# Patient Record
Sex: Female | Born: 1950 | Race: White | Hispanic: No | Smoking: Current every day smoker
Health system: Southern US, Community
[De-identification: ages and names within clinical notes are randomized; demographics above are authoritative.]

## PROBLEM LIST (undated history)

## (undated) DIAGNOSIS — M25559 Pain in unspecified hip: Secondary | ICD-10-CM

## (undated) DIAGNOSIS — F329 Major depressive disorder, single episode, unspecified: Secondary | ICD-10-CM

## (undated) DIAGNOSIS — F32A Depression, unspecified: Secondary | ICD-10-CM

## (undated) HISTORY — PX: HIP SURGERY: SHX245

---

## 2015-08-03 ENCOUNTER — Other Ambulatory Visit: Payer: Self-pay | Admitting: Internal Medicine

## 2015-08-06 ENCOUNTER — Other Ambulatory Visit: Payer: Self-pay | Admitting: Internal Medicine

## 2015-08-06 DIAGNOSIS — Z86010 Personal history of colonic polyps: Secondary | ICD-10-CM

## 2015-08-06 DIAGNOSIS — R1012 Left upper quadrant pain: Secondary | ICD-10-CM

## 2015-08-06 DIAGNOSIS — R634 Abnormal weight loss: Secondary | ICD-10-CM

## 2015-08-21 ENCOUNTER — Inpatient Hospital Stay: Admission: RE | Admit: 2015-08-21 | Payer: Self-pay | Source: Ambulatory Visit

## 2015-08-28 ENCOUNTER — Ambulatory Visit
Admission: RE | Admit: 2015-08-28 | Discharge: 2015-08-28 | Disposition: A | Discharge status: Home / Self Care | Payer: Self-pay | Source: Ambulatory Visit | Attending: Internal Medicine | Admitting: Internal Medicine

## 2015-08-28 DIAGNOSIS — Z86010 Personal history of colonic polyps: Secondary | ICD-10-CM

## 2015-08-28 DIAGNOSIS — R1012 Left upper quadrant pain: Secondary | ICD-10-CM

## 2015-08-28 DIAGNOSIS — R634 Abnormal weight loss: Secondary | ICD-10-CM

## 2017-02-05 ENCOUNTER — Emergency Department (HOSPITAL_COMMUNITY)
Admission: EM | Admit: 2017-02-05 | Discharge: 2017-02-06 | Disposition: A | Discharge status: Home / Self Care | Payer: Medicare Other | Attending: Emergency Medicine | Admitting: Emergency Medicine

## 2017-02-05 DIAGNOSIS — Z046 Encounter for general psychiatric examination, requested by authority: Secondary | ICD-10-CM | POA: Insufficient documentation

## 2017-02-05 DIAGNOSIS — Z733 Stress, not elsewhere classified: Secondary | ICD-10-CM | POA: Insufficient documentation

## 2017-02-05 DIAGNOSIS — N3000 Acute cystitis without hematuria: Secondary | ICD-10-CM | POA: Insufficient documentation

## 2017-02-05 DIAGNOSIS — R410 Disorientation, unspecified: Secondary | ICD-10-CM

## 2017-02-05 DIAGNOSIS — R300 Dysuria: Secondary | ICD-10-CM | POA: Insufficient documentation

## 2017-02-05 DIAGNOSIS — E876 Hypokalemia: Secondary | ICD-10-CM | POA: Diagnosis not present

## 2017-02-05 DIAGNOSIS — R4182 Altered mental status, unspecified: Secondary | ICD-10-CM | POA: Diagnosis present

## 2017-02-05 LAB — CBC WITH DIFFERENTIAL/PLATELET
Basophils Absolute: 0 10*3/uL (ref 0.0–0.1)
Basophils Relative: 0 %
Eosinophils Absolute: 0 10*3/uL (ref 0.0–0.7)
Eosinophils Relative: 0 %
HEMATOCRIT: 36.1 % (ref 36.0–46.0)
HEMOGLOBIN: 12.5 g/dL (ref 12.0–15.0)
LYMPHS ABS: 1.3 10*3/uL (ref 0.7–4.0)
Lymphocytes Relative: 12 %
MCH: 32.7 pg (ref 26.0–34.0)
MCHC: 34.6 g/dL (ref 30.0–36.0)
MCV: 94.5 fL (ref 78.0–100.0)
MONOS PCT: 8 %
Monocytes Absolute: 0.9 10*3/uL (ref 0.1–1.0)
NEUTROS ABS: 9 10*3/uL — AB (ref 1.7–7.7)
NEUTROS PCT: 80 %
Platelets: 255 10*3/uL (ref 150–400)
RBC: 3.82 MIL/uL — AB (ref 3.87–5.11)
RDW: 13.5 % (ref 11.5–15.5)
WBC: 11.3 10*3/uL — AB (ref 4.0–10.5)

## 2017-02-05 LAB — URINALYSIS, ROUTINE W REFLEX MICROSCOPIC
Bilirubin Urine: NEGATIVE
GLUCOSE, UA: NEGATIVE mg/dL
KETONES UR: NEGATIVE mg/dL
Leukocytes, UA: NEGATIVE
NITRITE: NEGATIVE
PROTEIN: NEGATIVE mg/dL
Specific Gravity, Urine: 1.019 (ref 1.005–1.030)
pH: 5 (ref 5.0–8.0)

## 2017-02-05 LAB — BASIC METABOLIC PANEL
Anion gap: 12 (ref 5–15)
BUN: 18 mg/dL (ref 6–20)
CHLORIDE: 102 mmol/L (ref 101–111)
CO2: 24 mmol/L (ref 22–32)
CREATININE: 0.56 mg/dL (ref 0.44–1.00)
Calcium: 9.2 mg/dL (ref 8.9–10.3)
GFR calc non Af Amer: >60 mL/min (ref >60)
Glucose, Bld: 126 mg/dL — ABNORMAL HIGH (ref 65–99)
POTASSIUM: 3 mmol/L — AB (ref 3.5–5.1)
SODIUM: 138 mmol/L (ref 135–145)

## 2017-02-05 MED ORDER — ACETAMINOPHEN 325 MG PO TABS
650.0000 mg | ORAL_TABLET | ORAL | Status: DC | PRN
  Administered 2017-02-05 – 2017-02-06 (×3): 650 mg via ORAL
  Filled 2017-02-05 (×3): qty 2

## 2017-02-05 MED ORDER — CEPHALEXIN 500 MG PO CAPS
500.0000 mg | ORAL_CAPSULE | Freq: Three times a day (TID) | ORAL | Status: DC
  Administered 2017-02-05 – 2017-02-06 (×2): 500 mg via ORAL
  Filled 2017-02-05 (×2): qty 1

## 2017-02-05 MED ORDER — CEPHALEXIN 500 MG PO CAPS: 500.0000 mg | ORAL_CAPSULE | Freq: Four times a day (QID) | ORAL | 0 refills | Status: DC

## 2017-02-05 MED ORDER — POTASSIUM CHLORIDE CRYS ER 20 MEQ PO TBCR
40.0000 meq | EXTENDED_RELEASE_TABLET | Freq: Once | ORAL | Status: AC
  Administered 2017-02-06: 40 meq via ORAL
  Filled 2017-02-05: qty 2

## 2017-02-05 MED ORDER — POTASSIUM CHLORIDE CRYS ER 20 MEQ PO TBCR
40.0000 meq | EXTENDED_RELEASE_TABLET | Freq: Once | ORAL | Status: AC
  Administered 2017-02-05: 40 meq via ORAL
  Filled 2017-02-05: qty 2

## 2017-02-05 NOTE — ED Notes (Signed)
Thus far we have gotten her a glass of Coke; a Malaw[place]urkey sandwich and a cup of coffee. As I write this, she is again making a phone call. She remains quite lucid.
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
## 2017-02-05 NOTE — Discharge Instructions (Signed)
Follow-up when you get back home to have your potassium rechecked.  Take the antibiotic Keflex as directed for the next 7 days.  Make an appointment to follow-up with your doctor.
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
## 2017-02-05 NOTE — ED Notes (Signed)
Bed: [id number] Expected date:  Expected time:  Means of arrival:  Comments: 66 yo medical clearance
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
## 2017-02-06 ENCOUNTER — Observation Stay (HOSPITAL_COMMUNITY)
Admission: EM | Admit: 2017-02-06 | Discharge: 2017-02-10 | Disposition: A | Discharge status: Home / Self Care | Payer: Medicare Other | Attending: Family Medicine | Admitting: Family Medicine

## 2017-02-06 ENCOUNTER — Emergency Department (HOSPITAL_COMMUNITY): Payer: Medicare Other

## 2017-02-06 ENCOUNTER — Other Ambulatory Visit: Payer: Self-pay

## 2017-02-06 ENCOUNTER — Encounter (HOSPITAL_COMMUNITY): Payer: Self-pay | Admitting: Cardiology

## 2017-02-06 DIAGNOSIS — M25551 Pain in right hip: Secondary | ICD-10-CM | POA: Diagnosis present

## 2017-02-06 DIAGNOSIS — Y999 Unspecified external cause status: Secondary | ICD-10-CM | POA: Insufficient documentation

## 2017-02-06 DIAGNOSIS — Z720 Tobacco use: Secondary | ICD-10-CM | POA: Diagnosis not present

## 2017-02-06 DIAGNOSIS — Y929 Unspecified place or not applicable: Secondary | ICD-10-CM | POA: Insufficient documentation

## 2017-02-06 DIAGNOSIS — E46 Unspecified protein-calorie malnutrition: Secondary | ICD-10-CM | POA: Diagnosis present

## 2017-02-06 DIAGNOSIS — S42001D Fracture of unspecified part of right clavicle, subsequent encounter for fracture with routine healing: Secondary | ICD-10-CM | POA: Diagnosis not present

## 2017-02-06 DIAGNOSIS — S42001A Fracture of unspecified part of right clavicle, initial encounter for closed fracture: Secondary | ICD-10-CM | POA: Diagnosis present

## 2017-02-06 DIAGNOSIS — F05 Delirium due to known physiological condition: Secondary | ICD-10-CM

## 2017-02-06 DIAGNOSIS — F322 Major depressive disorder, single episode, severe without psychotic features: Secondary | ICD-10-CM | POA: Diagnosis not present

## 2017-02-06 DIAGNOSIS — S20301A Unspecified superficial injuries of right front wall of thorax, initial encounter: Secondary | ICD-10-CM | POA: Insufficient documentation

## 2017-02-06 DIAGNOSIS — G9341 Metabolic encephalopathy: Secondary | ICD-10-CM

## 2017-02-06 DIAGNOSIS — S32010A Wedge compression fracture of first lumbar vertebra, initial encounter for closed fracture: Secondary | ICD-10-CM | POA: Diagnosis not present

## 2017-02-06 DIAGNOSIS — Y939 Activity, unspecified: Secondary | ICD-10-CM | POA: Insufficient documentation

## 2017-02-06 DIAGNOSIS — S0990XA Unspecified injury of head, initial encounter: Secondary | ICD-10-CM | POA: Insufficient documentation

## 2017-02-06 DIAGNOSIS — S3210XA Unspecified fracture of sacrum, initial encounter for closed fracture: Principal | ICD-10-CM | POA: Insufficient documentation

## 2017-02-06 DIAGNOSIS — E876 Hypokalemia: Secondary | ICD-10-CM | POA: Diagnosis not present

## 2017-02-06 DIAGNOSIS — Z79899 Other long term (current) drug therapy: Secondary | ICD-10-CM | POA: Diagnosis not present

## 2017-02-06 DIAGNOSIS — F17200 Nicotine dependence, unspecified, uncomplicated: Secondary | ICD-10-CM | POA: Insufficient documentation

## 2017-02-06 DIAGNOSIS — S3992XA Unspecified injury of lower back, initial encounter: Secondary | ICD-10-CM | POA: Diagnosis not present

## 2017-02-06 DIAGNOSIS — S2241XA Multiple fractures of ribs, right side, initial encounter for closed fracture: Secondary | ICD-10-CM | POA: Insufficient documentation

## 2017-02-06 HISTORY — DX: Pain in unspecified hip: M25.559

## 2017-02-06 HISTORY — DX: Depression, unspecified: F32.A

## 2017-02-06 HISTORY — DX: Major depressive disorder, single episode, unspecified: F32.9

## 2017-02-06 LAB — COMPREHENSIVE METABOLIC PANEL
ALT: 56 U/L — ABNORMAL HIGH (ref 14–54)
AST: 41 U/L (ref 15–41)
Albumin: 4.1 g/dL (ref 3.5–5.0)
Alkaline Phosphatase: 99 U/L (ref 38–126)
Anion gap: 11 (ref 5–15)
BUN: 17 mg/dL (ref 6–20)
CHLORIDE: 106 mmol/L (ref 101–111)
CO2: 26 mmol/L (ref 22–32)
Calcium: 9.7 mg/dL (ref 8.9–10.3)
Creatinine, Ser: 0.54 mg/dL (ref 0.44–1.00)
GFR calc Af Amer: >60 mL/min (ref >60)
Glucose, Bld: 103 mg/dL — ABNORMAL HIGH (ref 65–99)
POTASSIUM: 3.1 mmol/L — AB (ref 3.5–5.1)
Sodium: 143 mmol/L (ref 135–145)
Total Bilirubin: 0.8 mg/dL (ref 0.3–1.2)
Total Protein: 7.8 g/dL (ref 6.5–8.1)

## 2017-02-06 LAB — RAPID URINE DRUG SCREEN, HOSP PERFORMED
AMPHETAMINES: NOT DETECTED
BENZODIAZEPINES: NOT DETECTED
Barbiturates: POSITIVE — AB
COCAINE: NOT DETECTED
OPIATES: NOT DETECTED
Tetrahydrocannabinol: NOT DETECTED

## 2017-02-06 LAB — SALICYLATE LEVEL: Salicylate Lvl: <7 mg/dL (ref 2.8–30.0)

## 2017-02-06 LAB — CBC WITH DIFFERENTIAL/PLATELET
BASOS ABS: 0 10*3/uL (ref 0.0–0.1)
BASOS PCT: 0 %
EOS PCT: 0 %
Eosinophils Absolute: 0 10*3/uL (ref 0.0–0.7)
HCT: 39 % (ref 36.0–46.0)
Hemoglobin: 12.7 g/dL (ref 12.0–15.0)
LYMPHS PCT: 7 %
Lymphs Abs: 0.8 10*3/uL (ref 0.7–4.0)
MCH: 31.5 pg (ref 26.0–34.0)
MCHC: 32.6 g/dL (ref 30.0–36.0)
MCV: 96.8 fL (ref 78.0–100.0)
MONO ABS: 0.9 10*3/uL (ref 0.1–1.0)
Monocytes Relative: 7 %
Neutro Abs: 10.5 10*3/uL — ABNORMAL HIGH (ref 1.7–7.7)
Neutrophils Relative %: 86 %
PLATELETS: 228 10*3/uL (ref 150–400)
RBC: 4.03 MIL/uL (ref 3.87–5.11)
RDW: 13.5 % (ref 11.5–15.5)
WBC: 12.3 10*3/uL — ABNORMAL HIGH (ref 4.0–10.5)

## 2017-02-06 LAB — MAGNESIUM: MAGNESIUM: 2.1 mg/dL (ref 1.7–2.4)

## 2017-02-06 LAB — ACETAMINOPHEN LEVEL: Acetaminophen (Tylenol), Serum: <10 ug/mL — ABNORMAL LOW (ref 10–30)

## 2017-02-06 LAB — MRSA PCR SCREENING: MRSA BY PCR: NEGATIVE

## 2017-02-06 LAB — ETHANOL

## 2017-02-06 MED ORDER — FOLIC ACID 1 MG PO TABS
1.0000 mg | ORAL_TABLET | Freq: Every day | ORAL | Status: DC
  Administered 2017-02-07 – 2017-02-10 (×4): 1 mg via ORAL
  Filled 2017-02-06 (×4): qty 1

## 2017-02-06 MED ORDER — ENOXAPARIN SODIUM 40 MG/0.4ML ~~LOC~~ SOLN
40.0000 mg | SUBCUTANEOUS | Status: DC
  Administered 2017-02-06: 40 mg via SUBCUTANEOUS
  Filled 2017-02-06: qty 0.4

## 2017-02-06 MED ORDER — VITAMIN B-1 100 MG PO TABS
100.0000 mg | ORAL_TABLET | Freq: Every day | ORAL | Status: DC
  Administered 2017-02-07 – 2017-02-10 (×4): 100 mg via ORAL
  Filled 2017-02-06 (×4): qty 1

## 2017-02-06 MED ORDER — ONDANSETRON HCL 4 MG PO TABS
4.0000 mg | ORAL_TABLET | Freq: Four times a day (QID) | ORAL | Status: DC | PRN
  Administered 2017-02-07: 4 mg via ORAL
  Filled 2017-02-06: qty 1

## 2017-02-06 MED ORDER — OXYCODONE-ACETAMINOPHEN 5-325 MG PO TABS
1.0000 | ORAL_TABLET | Freq: Once | ORAL | Status: AC
  Administered 2017-02-06: 1 via ORAL
  Filled 2017-02-06: qty 1

## 2017-02-06 MED ORDER — IOPAMIDOL (ISOVUE-300) INJECTION 61%
100.0000 mL | Freq: Once | INTRAVENOUS | Status: AC | PRN
  Administered 2017-02-06: 100 mL via INTRAVENOUS

## 2017-02-06 MED ORDER — LORAZEPAM 1 MG PO TABS
1.0000 mg | ORAL_TABLET | Freq: Four times a day (QID) | ORAL | Status: AC | PRN
  Administered 2017-02-06 – 2017-02-09 (×2): 1 mg via ORAL
  Filled 2017-02-06 (×2): qty 1

## 2017-02-06 MED ORDER — ADULT MULTIVITAMIN W/MINERALS CH
1.0000 | ORAL_TABLET | Freq: Every day | ORAL | Status: DC
  Administered 2017-02-07 – 2017-02-10 (×4): 1 via ORAL
  Filled 2017-02-06 (×4): qty 1

## 2017-02-06 MED ORDER — ACETAMINOPHEN 650 MG RE SUPP: 650.0000 mg | Freq: Four times a day (QID) | RECTAL | Status: DC | PRN

## 2017-02-06 MED ORDER — LORAZEPAM 2 MG/ML IJ SOLN
1.0000 mg | Freq: Four times a day (QID) | INTRAMUSCULAR | Status: AC | PRN
  Administered 2017-02-07 – 2017-02-09 (×5): 1 mg via INTRAVENOUS
  Filled 2017-02-06 (×5): qty 1

## 2017-02-06 MED ORDER — POTASSIUM CHLORIDE CRYS ER 20 MEQ PO TBCR
40.0000 meq | EXTENDED_RELEASE_TABLET | Freq: Once | ORAL | Status: AC
  Administered 2017-02-06: 40 meq via ORAL
  Filled 2017-02-06: qty 2

## 2017-02-06 MED ORDER — ONDANSETRON HCL 4 MG/2ML IJ SOLN
4.0000 mg | Freq: Four times a day (QID) | INTRAMUSCULAR | Status: DC | PRN
  Administered 2017-02-07 – 2017-02-08 (×2): 4 mg via INTRAVENOUS
  Filled 2017-02-06 (×2): qty 2

## 2017-02-06 MED ORDER — THIAMINE HCL 100 MG/ML IJ SOLN: 100.0000 mg | Freq: Every day | INTRAMUSCULAR | Status: DC

## 2017-02-06 MED ORDER — KETOROLAC TROMETHAMINE 15 MG/ML IJ SOLN
15.0000 mg | Freq: Four times a day (QID) | INTRAMUSCULAR | Status: DC | PRN
  Administered 2017-02-10 (×2): 15 mg via INTRAVENOUS
  Filled 2017-02-06 (×2): qty 1

## 2017-02-06 MED ORDER — ACETAMINOPHEN 325 MG PO TABS: 650.0000 mg | ORAL_TABLET | Freq: Four times a day (QID) | ORAL | Status: DC | PRN

## 2017-02-06 MED ORDER — POTASSIUM CHLORIDE IN NACL 40-0.9 MEQ/L-% IV SOLN
INTRAVENOUS | Status: DC
  Administered 2017-02-06 – 2017-02-08 (×3): 100 mL/h via INTRAVENOUS
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
## 2017-02-06 NOTE — ED Notes (Signed)
Pt still unable to provide urine specimen 
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
## 2017-02-06 NOTE — ED Notes (Signed)
ED Provider at bedside. 

## 2017-02-06 NOTE — ED Notes (Signed)
RN went in to prepare pt for discharge, pt [place] [facility] [place] [facility] [place] [place]. I informed pt that pt has two options. Leaving with Deputy who would drive pt to car or pt can go to lobby and wait and attempt to find transportation by other means. Pt is extremely anxious and will leave with deputy.
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
## 2017-02-07 DIAGNOSIS — E46 Unspecified protein-calorie malnutrition: Secondary | ICD-10-CM | POA: Diagnosis not present

## 2017-02-07 DIAGNOSIS — S3210XA Unspecified fracture of sacrum, initial encounter for closed fracture: Secondary | ICD-10-CM | POA: Diagnosis not present

## 2017-02-07 DIAGNOSIS — S32010G Wedge compression fracture of first lumbar vertebra, subsequent encounter for fracture with delayed healing: Secondary | ICD-10-CM | POA: Diagnosis not present

## 2017-02-07 DIAGNOSIS — E876 Hypokalemia: Secondary | ICD-10-CM | POA: Diagnosis not present

## 2017-02-07 DIAGNOSIS — S322XXA Fracture of coccyx, initial encounter for closed fracture: Secondary | ICD-10-CM

## 2017-02-07 DIAGNOSIS — Z720 Tobacco use: Secondary | ICD-10-CM | POA: Diagnosis not present

## 2017-02-07 DIAGNOSIS — F17200 Nicotine dependence, unspecified, uncomplicated: Secondary | ICD-10-CM

## 2017-02-07 DIAGNOSIS — S3210XG Unspecified fracture of sacrum, subsequent encounter for fracture with delayed healing: Secondary | ICD-10-CM | POA: Diagnosis not present

## 2017-02-07 DIAGNOSIS — S42309D Unspecified fracture of shaft of humerus, unspecified arm, subsequent encounter for fracture with routine healing: Secondary | ICD-10-CM

## 2017-02-07 DIAGNOSIS — S42001D Fracture of unspecified part of right clavicle, subsequent encounter for fracture with routine healing: Secondary | ICD-10-CM | POA: Diagnosis not present

## 2017-02-07 DIAGNOSIS — G9341 Metabolic encephalopathy: Secondary | ICD-10-CM | POA: Diagnosis not present

## 2017-02-07 DIAGNOSIS — S34139A Unspecified injury to sacral spinal cord, initial encounter: Secondary | ICD-10-CM

## 2017-02-07 LAB — CBC
HCT: 34.4 % — ABNORMAL LOW (ref 36.0–46.0)
Hemoglobin: 11.2 g/dL — ABNORMAL LOW (ref 12.0–15.0)
MCH: 31.6 pg (ref 26.0–34.0)
MCHC: 32.6 g/dL (ref 30.0–36.0)
MCV: 97.2 fL (ref 78.0–100.0)
PLATELETS: 200 10*3/uL (ref 150–400)
RBC: 3.54 MIL/uL — AB (ref 3.87–5.11)
RDW: 13.6 % (ref 11.5–15.5)
WBC: 8.2 10*3/uL (ref 4.0–10.5)

## 2017-02-07 LAB — TSH: TSH: 1.338 u[IU]/mL (ref 0.350–4.500)

## 2017-02-07 LAB — T4, FREE: Free T4: 0.95 ng/dL (ref 0.61–1.12)

## 2017-02-07 LAB — BASIC METABOLIC PANEL
Anion gap: 11 (ref 5–15)
BUN: 16 mg/dL (ref 6–20)
CALCIUM: 8.7 mg/dL — AB (ref 8.9–10.3)
CO2: 20 mmol/L — ABNORMAL LOW (ref 22–32)
CREATININE: 0.49 mg/dL (ref 0.44–1.00)
Chloride: 104 mmol/L (ref 101–111)
GFR calc non Af Amer: >60 mL/min (ref >60)
Glucose, Bld: 92 mg/dL (ref 65–99)
Potassium: 4.2 mmol/L (ref 3.5–5.1)
SODIUM: 135 mmol/L (ref 135–145)

## 2017-02-07 LAB — VITAMIN B12: Vitamin B-12: 221 pg/mL (ref 180–914)

## 2017-02-07 LAB — AMMONIA: AMMONIA: 12 umol/L (ref 9–35)

## 2017-02-07 LAB — GLUCOSE, CAPILLARY: GLUCOSE-CAPILLARY: 86 mg/dL (ref 65–99)

## 2017-02-07 MED ORDER — ACETAMINOPHEN 500 MG PO TABS: 1000.0000 mg | ORAL_TABLET | Freq: Four times a day (QID) | ORAL | Status: DC | PRN

## 2017-02-07 MED ORDER — PLECANATIDE 3 MG PO TABS: 1.0000 | ORAL_TABLET | Freq: Every day | ORAL | Status: DC

## 2017-02-07 MED ORDER — ENSURE ENLIVE PO LIQD
237.0000 mL | Freq: Two times a day (BID) | ORAL | Status: DC
  Administered 2017-02-08 – 2017-02-09 (×3): 237 mL via ORAL

## 2017-02-07 MED ORDER — HYDROCODONE-ACETAMINOPHEN 5-325 MG PO TABS
1.0000 | ORAL_TABLET | ORAL | Status: DC | PRN
  Administered 2017-02-07 – 2017-02-10 (×11): 1 via ORAL
  Filled 2017-02-07 (×11): qty 1

## 2017-02-07 MED ORDER — AMITRIPTYLINE HCL 25 MG PO TABS: 50.0000 mg | ORAL_TABLET | Freq: Every evening | ORAL | Status: DC | PRN

## 2017-02-07 MED ORDER — ENOXAPARIN SODIUM 30 MG/0.3ML ~~LOC~~ SOLN
30.0000 mg | SUBCUTANEOUS | Status: DC
  Administered 2017-02-07 – 2017-02-08 (×2): 30 mg via SUBCUTANEOUS
  Filled 2017-02-07 (×2): qty 0.3

## 2017-02-07 NOTE — Care Management Obs Status (Signed)
MEDICARE OBSERVATION STATUS NOTIFICATION   Patient Details  Name: Vanessa Bailey MRN: [id number] Date of Birth: 05/14/1950   Medicare Observation Status Notification Given:  Yes    Mehar Sagen, Chrystine OilerSharley Diane, RN 02/07/2017, 2:32 PM

## 2017-02-07 NOTE — Plan of Care (Signed)
  Acute Rehab PT Goals(only PT should resolve) Pt Will Go Supine/Side To Sit 02/07/2017 1525 - Progressing by Ocie BobWatkins, Kol Consuegra, PT Flowsheets Taken 02/07/2017 1525  Pt will go Supine/Side to Sit with minimal assist Pt Will Go Sit To Supine/Side 02/07/2017 1525 - Progressing by Ocie BobWatkins, Blessed Girdner, PT Flowsheets Taken 02/07/2017 1525  Pt will go Sit to Supine/Side with minimal assist Patient Will Transfer Sit To/From Stand 02/07/2017 1525 - Progressing by Ocie BobWatkins, Lonell Stamos, PT Flowsheets Taken 02/07/2017 1525  Patient will transfer sit to/from stand with moderate assist Pt Will Transfer Bed To Chair/Chair To Bed 02/07/2017 1525 - Progressing by Ocie BobWatkins, Karynn Deblasi, PT Flowsheets Taken 02/07/2017 1525  Pt will Transfer Bed to Chair/Chair to Bed with mod assist Pt Will Ambulate 02/07/2017 1525 - Progressing by Ocie BobWatkins, Terriah Reggio, PT Flowsheets Taken 02/07/2017 1525  Pt will Ambulate 15 feet;with moderate assist;with rolling walker  3:26 PM, 02/07/17 Ocie BobJames Chiara Coltrin, MPT Physical Therapist with [facility] 336 [phone] office [phone] mobile phone

## 2017-02-07 NOTE — Progress Notes (Signed)
Patient meets criteria for inpatient treatment. CSW faxed referrals to the following inpatient facilities for review:  [facility], [facility], [facility], [facility], [facility], [facility]   TTS will continue to seek bed placement.   Vanessa DaubJolan Dru Bailey MSW, LCSWA CSW Disposition [phone]
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
## 2017-02-07 NOTE — Progress Notes (Signed)
Patient alert to self, disoriented to time/date/place/situation. Pt noted referring to persons who are not there stating "There is a man right there, you don't see him?" Pt noted reaching out "grasping" objects "floating" above her that are not present. Poor physical and oral hygiene noted. Pt is very thin and reports that she gets nauseous when she smells food and that makes her not want to eat. Pt reports that her husband passed away 5wks ago and becomes very upset & tearful when she speaks of him. Patient transferred to Dept 300 room# 307 on telemetry. Report called and given to receiving nurse Angie.
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
## 2017-02-07 NOTE — Progress Notes (Signed)
Transported from ICU at 1235.  Alert and oriented to name and cannot state date and stated was at grandmother's house. Speech rambling then and nonsensical .  Fidgity.  Swallowed water and pain pill.  Right arm in sling.  Vitals stable. Bed alarm set
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
## 2017-02-07 NOTE — Care Management (Signed)
Writer followed up on the following referrals: [facility] - Left a message [place] - at capacity, per [facility] [place]- Still reviewing referral, per [facility]- Ref[facility] will be re-faxed  [facility]  [facility]
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
## 2017-02-08 ENCOUNTER — Encounter (HOSPITAL_COMMUNITY): Payer: Self-pay | Admitting: Registered Nurse

## 2017-02-08 DIAGNOSIS — S32010G Wedge compression fracture of first lumbar vertebra, subsequent encounter for fracture with delayed healing: Secondary | ICD-10-CM

## 2017-02-08 DIAGNOSIS — S32010A Wedge compression fracture of first lumbar vertebra, initial encounter for closed fracture: Secondary | ICD-10-CM | POA: Diagnosis not present

## 2017-02-08 DIAGNOSIS — F05 Delirium due to known physiological condition: Secondary | ICD-10-CM | POA: Diagnosis not present

## 2017-02-08 DIAGNOSIS — S3210XG Unspecified fracture of sacrum, subsequent encounter for fracture with delayed healing: Secondary | ICD-10-CM | POA: Diagnosis not present

## 2017-02-08 DIAGNOSIS — G9341 Metabolic encephalopathy: Secondary | ICD-10-CM | POA: Diagnosis not present

## 2017-02-08 DIAGNOSIS — Z720 Tobacco use: Secondary | ICD-10-CM

## 2017-02-08 DIAGNOSIS — S3210XA Unspecified fracture of sacrum, initial encounter for closed fracture: Secondary | ICD-10-CM | POA: Diagnosis not present

## 2017-02-08 DIAGNOSIS — E876 Hypokalemia: Secondary | ICD-10-CM | POA: Diagnosis not present

## 2017-02-08 DIAGNOSIS — S42001D Fracture of unspecified part of right clavicle, subsequent encounter for fracture with routine healing: Secondary | ICD-10-CM

## 2017-02-08 DIAGNOSIS — E46 Unspecified protein-calorie malnutrition: Secondary | ICD-10-CM

## 2017-02-08 LAB — BASIC METABOLIC PANEL
ANION GAP: 7 (ref 5–15)
ANION GAP: 7 (ref 5–15)
BUN: 28 mg/dL — ABNORMAL HIGH (ref 6–20)
BUN: 22 mg/dL — AB (ref 6–20)
CALCIUM: 9 mg/dL (ref 8.9–10.3)
CALCIUM: 8.5 mg/dL — AB (ref 8.9–10.3)
CO2: 22 mmol/L (ref 22–32)
CO2: 23 mmol/L (ref 22–32)
CREATININE: 0.62 mg/dL (ref 0.44–1.00)
CREATININE: 0.56 mg/dL (ref 0.44–1.00)
Chloride: 111 mmol/L (ref 101–111)
Chloride: 103 mmol/L (ref 101–111)
GFR calc Af Amer: >60 mL/min (ref >60)
GFR calc non Af Amer: >60 mL/min (ref >60)
GLUCOSE: 114 mg/dL — AB (ref 65–99)
Glucose, Bld: 129 mg/dL — ABNORMAL HIGH (ref 65–99)
Potassium: 6.2 mmol/L — ABNORMAL HIGH (ref 3.5–5.1)
Potassium: 3.9 mmol/L (ref 3.5–5.1)
SODIUM: 140 mmol/L (ref 135–145)
Sodium: 133 mmol/L — ABNORMAL LOW (ref 135–145)

## 2017-02-08 LAB — RPR: RPR Ser Ql: NONREACTIVE

## 2017-02-08 LAB — CREATININE, URINE, RANDOM: Creatinine, Urine: 58.8 mg/dL

## 2017-02-08 LAB — SODIUM, URINE, RANDOM: SODIUM UR: 169 mmol/L

## 2017-02-08 LAB — GLUCOSE, CAPILLARY
GLUCOSE-CAPILLARY: 62 mg/dL — AB (ref 65–99)
Glucose-Capillary: 135 mg/dL — ABNORMAL HIGH (ref 65–99)

## 2017-02-08 LAB — OSMOLALITY, URINE: Osmolality, Ur: 906 mOsm/kg — ABNORMAL HIGH (ref 300–900)

## 2017-02-08 LAB — HIV ANTIBODY (ROUTINE TESTING W REFLEX): HIV SCREEN 4TH GENERATION: NONREACTIVE

## 2017-02-08 LAB — POTASSIUM
Potassium: 4.3 mmol/L (ref 3.5–5.1)
Potassium: 3.9 mmol/L (ref 3.5–5.1)

## 2017-02-08 LAB — MAGNESIUM: MAGNESIUM: 2.1 mg/dL (ref 1.7–2.4)

## 2017-02-08 MED ORDER — ORAL CARE MOUTH RINSE
15.0000 mL | Freq: Two times a day (BID) | OROMUCOSAL | Status: DC
  Administered 2017-02-08: 15 mL via OROMUCOSAL

## 2017-02-08 MED ORDER — AMITRIPTYLINE HCL 25 MG PO TABS
25.0000 mg | ORAL_TABLET | Freq: Every evening | ORAL | Status: DC | PRN
  Administered 2017-02-10: 25 mg via ORAL
  Filled 2017-02-08: qty 1

## 2017-02-08 MED ORDER — SODIUM CHLORIDE 0.9 % IV BOLUS (SEPSIS)
500.0000 mL | Freq: Once | INTRAVENOUS | Status: AC
  Administered 2017-02-08: 500 mL via INTRAVENOUS

## 2017-02-08 MED ORDER — SODIUM CHLORIDE 0.9 % IV SOLN
INTRAVENOUS | Status: DC
  Administered 2017-02-08 (×2): via INTRAVENOUS
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
## 2017-02-08 NOTE — Clinical Social Work Note (Signed)
LCSW following. Notified by RN CM that behavioral health now saying that pt does not need inpt behavioral health hospitalization. Reviewed record and spoke by phone with NP, Shuvon Rankin, who indicated that she feels pt's issues are medical and not psychiatric. She feels pt has a delirium and suggests medical management as well as CSW follow up with pt/family to determine baseline functioning.   LCSW spoke by phone with pt's emergency contact, Vanessa Bailey. Vanessa Bailey is a friend of pt's and her late husband's. He lives about 1/2 mile away from pt's home. Vanessa Bailey states that he does not believe pt has any family support. He states that pt's husband died on 10/31 after a two month NH stay [facility] in [place], [place]. He thinks that they only family between the two of them is a cousin of pt's husband who lives in [place]. Vanessa Bailey states that pt has always been very independent in ADLs and had not trouble with confusion until some time in November when her MD changed the dose of her Amytriptoline. Vanessa Bailey thinks that pt started getting confused shortly after that.   Pt is observation status currently. She has a [employer] insurance policy on the record for her insurance as well as Medicare. Pt would not have Medicare coverage for SNF at this time due to observation status. Vanessa Bailey unsure if pt has any financial resources to pay privately for NH. Pt is still unable to provide meaningful information to LCSW.   Updated MD on all of above. She will further evaluate pt and LCSW will follow up tomorrow to further assess and assist.

## 2017-02-09 DIAGNOSIS — S42001D Fracture of unspecified part of right clavicle, subsequent encounter for fracture with routine healing: Secondary | ICD-10-CM | POA: Diagnosis not present

## 2017-02-09 DIAGNOSIS — S3210XG Unspecified fracture of sacrum, subsequent encounter for fracture with delayed healing: Secondary | ICD-10-CM | POA: Diagnosis not present

## 2017-02-09 DIAGNOSIS — E876 Hypokalemia: Secondary | ICD-10-CM | POA: Diagnosis not present

## 2017-02-09 DIAGNOSIS — S32010A Wedge compression fracture of first lumbar vertebra, initial encounter for closed fracture: Secondary | ICD-10-CM | POA: Diagnosis not present

## 2017-02-09 DIAGNOSIS — S3210XA Unspecified fracture of sacrum, initial encounter for closed fracture: Secondary | ICD-10-CM | POA: Diagnosis not present

## 2017-02-09 DIAGNOSIS — E46 Unspecified protein-calorie malnutrition: Secondary | ICD-10-CM | POA: Diagnosis not present

## 2017-02-09 DIAGNOSIS — F05 Delirium due to known physiological condition: Secondary | ICD-10-CM | POA: Diagnosis not present

## 2017-02-09 DIAGNOSIS — G9341 Metabolic encephalopathy: Secondary | ICD-10-CM | POA: Diagnosis not present

## 2017-02-09 LAB — POTASSIUM
POTASSIUM: 3.7 mmol/L (ref 3.5–5.1)
POTASSIUM: 3.5 mmol/L (ref 3.5–5.1)
Potassium: 3.7 mmol/L (ref 3.5–5.1)

## 2017-02-09 LAB — GLUCOSE, CAPILLARY
GLUCOSE-CAPILLARY: 90 mg/dL (ref 65–99)
Glucose-Capillary: 152 mg/dL — ABNORMAL HIGH (ref 65–99)

## 2017-02-09 NOTE — Progress Notes (Signed)
Patient is tearful and anxious. Repeating she needs to go home. MD notified of patient behavior.
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
## 2017-02-10 DIAGNOSIS — S32010G Wedge compression fracture of first lumbar vertebra, subsequent encounter for fracture with delayed healing: Secondary | ICD-10-CM | POA: Diagnosis not present

## 2017-02-10 DIAGNOSIS — E876 Hypokalemia: Secondary | ICD-10-CM | POA: Diagnosis not present

## 2017-02-10 DIAGNOSIS — S3210XG Unspecified fracture of sacrum, subsequent encounter for fracture with delayed healing: Secondary | ICD-10-CM | POA: Diagnosis not present

## 2017-02-10 DIAGNOSIS — E46 Unspecified protein-calorie malnutrition: Secondary | ICD-10-CM | POA: Diagnosis not present

## 2017-02-10 DIAGNOSIS — F05 Delirium due to known physiological condition: Secondary | ICD-10-CM | POA: Diagnosis not present

## 2017-02-10 DIAGNOSIS — Z720 Tobacco use: Secondary | ICD-10-CM | POA: Diagnosis not present

## 2017-02-10 DIAGNOSIS — S32010A Wedge compression fracture of first lumbar vertebra, initial encounter for closed fracture: Secondary | ICD-10-CM | POA: Diagnosis not present

## 2017-02-10 DIAGNOSIS — S42001D Fracture of unspecified part of right clavicle, subsequent encounter for fracture with routine healing: Secondary | ICD-10-CM | POA: Diagnosis not present

## 2017-02-10 LAB — GLUCOSE, CAPILLARY
GLUCOSE-CAPILLARY: 88 mg/dL (ref 65–99)
GLUCOSE-CAPILLARY: 155 mg/dL — AB (ref 65–99)

## 2017-02-10 MED ORDER — ACETAMINOPHEN 500 MG PO TABS
1000.0000 mg | ORAL_TABLET | Freq: Four times a day (QID) | ORAL | Status: DC | PRN
  Administered 2017-02-10: 1000 mg via ORAL
  Filled 2017-02-10: qty 2

## 2017-02-10 MED ORDER — HYDROCODONE-ACETAMINOPHEN 5-325 MG PO TABS
1.0000 | ORAL_TABLET | ORAL | Status: DC | PRN
  Administered 2017-02-10: 2 via ORAL
  Filled 2017-02-10: qty 2

## 2017-02-10 MED ORDER — HYDROCODONE-ACETAMINOPHEN 5-325 MG PO TABS: 1.0000 | ORAL_TABLET | ORAL | 0 refills | Status: AC | PRN

## 2017-02-10 MED ORDER — AMITRIPTYLINE HCL 25 MG PO TABS: 25.0000 mg | ORAL_TABLET | Freq: Every evening | ORAL | 0 refills | Status: AC | PRN
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
## 2017-02-10 NOTE — Progress Notes (Addendum)
Report called to [facility] [place], [phone], Spoke with Levon HedgerKathy RN. Patient going to room 235. This nurse gave her call back number for any further questions if needed.

## 2017-02-10 NOTE — Discharge Summary (Addendum)
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  WBC,  LACTICIDVEN Microbiology Recent Results (from the past 240 hour(s))  MRSA PCR Screening     Status: None   Collection Time: 02/06/17  9:55 PM  Result Value Ref Range Status   MRSA by PCR NEGATIVE NEGATIVE Final    Comment:        The GeneXpert MRSA Assay (FDA approved for NASAL specimens only), is one component of a comprehensive MRSA colonization surveillance program. It is not intended to diagnose MRSA infection nor to guide or monitor treatment for MRSA infections.      Time coordinating discharge: 35 minutes  SIGNED:   Katrinka BlazingAlex U Kadolph, MD  [facility] 02/10/2017, 11:52 AM Pager [phone] If 7PM-7AM, please contact night-coverage [email or web address] Password [email or web address]
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
## 2017-02-10 NOTE — Progress Notes (Signed)
Patient IV removed, telemetry removed, tolerated well.  
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
## 2017-02-10 NOTE — Progress Notes (Signed)
After receiving report at 0300, this RN noticed pt's fluid were not infusing. Unsure for extent of time they have been off. IV is also infiltrated. Starting new IV and fluids. Genelle Balameron D Tel Hevia, RN
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
